# Patient Record
Sex: Female | Born: 1980 | Race: Black or African American | Hispanic: No | Marital: Single | Smoking: Current every day smoker
Health system: Southern US, Community
[De-identification: ages and names within clinical notes are randomized; demographics above are authoritative.]

---

## 2010-02-11 ENCOUNTER — Emergency Department (HOSPITAL_COMMUNITY): Admission: EM | Admit: 2010-02-11 | Discharge: 2010-02-11 | Payer: Self-pay | Admitting: Emergency Medicine

## 2014-06-20 DIAGNOSIS — N63 Unspecified lump in unspecified breast: Secondary | ICD-10-CM | POA: Insufficient documentation

## 2014-06-20 DIAGNOSIS — Z87898 Personal history of other specified conditions: Secondary | ICD-10-CM | POA: Insufficient documentation

## 2014-06-20 DIAGNOSIS — E559 Vitamin D deficiency, unspecified: Secondary | ICD-10-CM | POA: Insufficient documentation

## 2014-06-20 DIAGNOSIS — E669 Obesity, unspecified: Secondary | ICD-10-CM | POA: Insufficient documentation

## 2014-06-20 DIAGNOSIS — N926 Irregular menstruation, unspecified: Secondary | ICD-10-CM | POA: Insufficient documentation

## 2014-06-20 DIAGNOSIS — Z01419 Encounter for gynecological examination (general) (routine) without abnormal findings: Secondary | ICD-10-CM | POA: Insufficient documentation

## 2015-06-25 HISTORY — PX: BREAST BIOPSY: SHX20

## 2019-09-07 ENCOUNTER — Other Ambulatory Visit: Payer: Self-pay | Admitting: Internal Medicine

## 2019-09-07 DIAGNOSIS — M79605 Pain in left leg: Secondary | ICD-10-CM

## 2019-09-09 ENCOUNTER — Other Ambulatory Visit: Payer: Self-pay | Admitting: Internal Medicine

## 2019-09-09 ENCOUNTER — Other Ambulatory Visit: Payer: Self-pay

## 2019-09-09 ENCOUNTER — Ambulatory Visit
Admission: RE | Admit: 2019-09-09 | Discharge: 2019-09-09 | Disposition: A | Discharge status: Home / Self Care | Payer: Managed Care, Other (non HMO) | Source: Ambulatory Visit | Attending: Internal Medicine | Admitting: Internal Medicine

## 2019-09-09 DIAGNOSIS — M79605 Pain in left leg: Secondary | ICD-10-CM | POA: Diagnosis not present

## 2019-09-09 DIAGNOSIS — R0602 Shortness of breath: Secondary | ICD-10-CM

## 2019-09-09 MED ORDER — IOHEXOL 350 MG/ML SOLN
75.0000 mL | Freq: Once | INTRAVENOUS | Status: AC | PRN
  Administered 2019-09-09: 75 mL via INTRAVENOUS

## 2019-09-23 DIAGNOSIS — I82452 Acute embolism and thrombosis of left peroneal vein: Secondary | ICD-10-CM | POA: Insufficient documentation

## 2020-01-14 ENCOUNTER — Other Ambulatory Visit (HOSPITAL_COMMUNITY): Payer: Self-pay | Admitting: Internal Medicine

## 2020-01-14 ENCOUNTER — Other Ambulatory Visit: Payer: Self-pay | Admitting: Internal Medicine

## 2020-01-14 DIAGNOSIS — I82452 Acute embolism and thrombosis of left peroneal vein: Secondary | ICD-10-CM

## 2020-02-07 ENCOUNTER — Ambulatory Visit
Admission: RE | Admit: 2020-02-07 | Discharge: 2020-02-07 | Disposition: A | Discharge status: Home / Self Care | Payer: Managed Care, Other (non HMO) | Source: Ambulatory Visit | Attending: Internal Medicine | Admitting: Internal Medicine

## 2020-02-07 ENCOUNTER — Other Ambulatory Visit: Payer: Self-pay

## 2020-02-07 DIAGNOSIS — I82452 Acute embolism and thrombosis of left peroneal vein: Secondary | ICD-10-CM | POA: Diagnosis present

## 2020-05-31 ENCOUNTER — Other Ambulatory Visit
Admission: RE | Admit: 2020-05-31 | Discharge: 2020-05-31 | Disposition: A | Discharge status: Home / Self Care | Payer: Managed Care, Other (non HMO) | Source: Ambulatory Visit | Attending: Internal Medicine | Admitting: Internal Medicine

## 2020-05-31 DIAGNOSIS — R079 Chest pain, unspecified: Secondary | ICD-10-CM | POA: Insufficient documentation

## 2020-05-31 LAB — TROPONIN I (HIGH SENSITIVITY): Troponin I (High Sensitivity): 3 ng/L (ref <18)

## 2020-07-21 DIAGNOSIS — Z1231 Encounter for screening mammogram for malignant neoplasm of breast: Secondary | ICD-10-CM | POA: Diagnosis not present

## 2020-07-21 DIAGNOSIS — I82432 Acute embolism and thrombosis of left popliteal vein: Secondary | ICD-10-CM | POA: Diagnosis not present

## 2020-07-21 DIAGNOSIS — M5440 Lumbago with sciatica, unspecified side: Secondary | ICD-10-CM | POA: Diagnosis not present

## 2020-07-21 DIAGNOSIS — Z0000 Encounter for general adult medical examination without abnormal findings: Secondary | ICD-10-CM | POA: Diagnosis not present

## 2020-07-25 ENCOUNTER — Other Ambulatory Visit: Payer: Self-pay | Admitting: Internal Medicine

## 2020-07-25 DIAGNOSIS — Z1231 Encounter for screening mammogram for malignant neoplasm of breast: Secondary | ICD-10-CM

## 2020-09-18 ENCOUNTER — Other Ambulatory Visit: Payer: Self-pay | Admitting: *Deleted

## 2020-09-18 ENCOUNTER — Inpatient Hospital Stay
Admission: RE | Admit: 2020-09-18 | Discharge: 2020-09-18 | Disposition: A | Discharge status: Home / Self Care | Payer: Self-pay | Source: Ambulatory Visit | Attending: *Deleted | Admitting: *Deleted

## 2020-09-18 ENCOUNTER — Ambulatory Visit
Admission: RE | Admit: 2020-09-18 | Discharge: 2020-09-18 | Disposition: A | Discharge status: Home / Self Care | Payer: BC Managed Care – PPO | Source: Ambulatory Visit | Attending: Internal Medicine | Admitting: Internal Medicine

## 2020-09-18 ENCOUNTER — Other Ambulatory Visit: Payer: Self-pay

## 2020-09-18 DIAGNOSIS — Z1231 Encounter for screening mammogram for malignant neoplasm of breast: Secondary | ICD-10-CM | POA: Diagnosis not present

## 2020-10-23 DIAGNOSIS — H40153 Residual stage of open-angle glaucoma, bilateral: Secondary | ICD-10-CM | POA: Diagnosis not present

## 2020-10-24 ENCOUNTER — Encounter: Payer: Self-pay | Admitting: Emergency Medicine

## 2020-10-24 ENCOUNTER — Emergency Department
Admission: EM | Admit: 2020-10-24 | Discharge: 2020-10-24 | Disposition: A | Discharge status: Home / Self Care | Payer: BC Managed Care – PPO | Attending: Emergency Medicine | Admitting: Emergency Medicine

## 2020-10-24 ENCOUNTER — Other Ambulatory Visit: Payer: Self-pay | Admitting: Internal Medicine

## 2020-10-24 ENCOUNTER — Emergency Department: Payer: BC Managed Care – PPO

## 2020-10-24 DIAGNOSIS — M79605 Pain in left leg: Secondary | ICD-10-CM | POA: Diagnosis not present

## 2020-10-24 DIAGNOSIS — M79662 Pain in left lower leg: Secondary | ICD-10-CM | POA: Insufficient documentation

## 2020-10-24 DIAGNOSIS — M7989 Other specified soft tissue disorders: Secondary | ICD-10-CM

## 2020-10-24 DIAGNOSIS — F17210 Nicotine dependence, cigarettes, uncomplicated: Secondary | ICD-10-CM | POA: Diagnosis not present

## 2020-10-24 DIAGNOSIS — N390 Urinary tract infection, site not specified: Secondary | ICD-10-CM | POA: Insufficient documentation

## 2020-10-24 DIAGNOSIS — I82432 Acute embolism and thrombosis of left popliteal vein: Secondary | ICD-10-CM

## 2020-10-24 DIAGNOSIS — Z8616 Personal history of COVID-19: Secondary | ICD-10-CM | POA: Insufficient documentation

## 2020-10-24 DIAGNOSIS — R002 Palpitations: Secondary | ICD-10-CM | POA: Insufficient documentation

## 2020-10-24 DIAGNOSIS — R079 Chest pain, unspecified: Secondary | ICD-10-CM | POA: Diagnosis not present

## 2020-10-24 LAB — URINALYSIS, COMPLETE (UACMP) WITH MICROSCOPIC
Bilirubin Urine: NEGATIVE
Glucose, UA: NEGATIVE mg/dL
Ketones, ur: NEGATIVE mg/dL
Nitrite: NEGATIVE
Protein, ur: NEGATIVE mg/dL
Specific Gravity, Urine: 1.018 (ref 1.005–1.030)
pH: 6 (ref 5.0–8.0)

## 2020-10-24 LAB — TSH: TSH: 4.633 u[IU]/mL — ABNORMAL HIGH (ref 0.350–4.500)

## 2020-10-24 LAB — CBC WITH DIFFERENTIAL/PLATELET
Abs Immature Granulocytes: 0.01 10*3/uL (ref 0.00–0.07)
Basophils Absolute: 0 10*3/uL (ref 0.0–0.1)
Basophils Relative: 0 %
Eosinophils Absolute: 0.1 10*3/uL (ref 0.0–0.5)
Eosinophils Relative: 1 %
HCT: 38.1 % (ref 36.0–46.0)
Hemoglobin: 13.4 g/dL (ref 12.0–15.0)
Immature Granulocytes: 0 %
Lymphocytes Relative: 47 %
Lymphs Abs: 4.6 10*3/uL — ABNORMAL HIGH (ref 0.7–4.0)
MCH: 31.8 pg (ref 26.0–34.0)
MCHC: 35.2 g/dL (ref 30.0–36.0)
MCV: 90.5 fL (ref 80.0–100.0)
Monocytes Absolute: 0.7 10*3/uL (ref 0.1–1.0)
Monocytes Relative: 7 %
Neutro Abs: 4.5 10*3/uL (ref 1.7–7.7)
Neutrophils Relative %: 45 %
Platelets: 379 10*3/uL (ref 150–400)
RBC: 4.21 MIL/uL (ref 3.87–5.11)
RDW: 12.8 % (ref 11.5–15.5)
WBC: 10 10*3/uL (ref 4.0–10.5)
nRBC: 0 % (ref 0.0–0.2)

## 2020-10-24 LAB — COMPREHENSIVE METABOLIC PANEL
ALT: 11 U/L (ref 0–44)
AST: 12 U/L — ABNORMAL LOW (ref 15–41)
Albumin: 3.5 g/dL (ref 3.5–5.0)
Alkaline Phosphatase: 58 U/L (ref 38–126)
Anion gap: 7 (ref 5–15)
BUN: 12 mg/dL (ref 6–20)
CO2: 26 mmol/L (ref 22–32)
Calcium: 8.7 mg/dL — ABNORMAL LOW (ref 8.9–10.3)
Chloride: 105 mmol/L (ref 98–111)
Creatinine, Ser: 0.71 mg/dL (ref 0.44–1.00)
GFR, Estimated: >60 mL/min (ref >60)
Glucose, Bld: 97 mg/dL (ref 70–99)
Potassium: 4 mmol/L (ref 3.5–5.1)
Sodium: 138 mmol/L (ref 135–145)
Total Bilirubin: 0.5 mg/dL (ref 0.3–1.2)
Total Protein: 6.4 g/dL — ABNORMAL LOW (ref 6.5–8.1)

## 2020-10-24 LAB — T4, FREE: Free T4: 1.03 ng/dL (ref 0.61–1.12)

## 2020-10-24 LAB — POC URINE PREG, ED: Preg Test, Ur: NEGATIVE

## 2020-10-24 LAB — BRAIN NATRIURETIC PEPTIDE: B Natriuretic Peptide: 16.2 pg/mL (ref 0.0–100.0)

## 2020-10-24 LAB — TROPONIN I (HIGH SENSITIVITY)
Troponin I (High Sensitivity): 3 ng/L (ref <18)
Troponin I (High Sensitivity): 3 ng/L (ref <18)

## 2020-10-24 MED ORDER — CEPHALEXIN 500 MG PO CAPS
500.0000 mg | ORAL_CAPSULE | Freq: Once | ORAL | Status: AC
  Administered 2020-10-24: 500 mg via ORAL
  Filled 2020-10-24: qty 1

## 2020-10-24 MED ORDER — SODIUM CHLORIDE 0.9 % IV BOLUS
1000.0000 mL | Freq: Once | INTRAVENOUS | Status: AC
  Administered 2020-10-24: 1000 mL via INTRAVENOUS

## 2020-10-24 MED ORDER — IOHEXOL 350 MG/ML SOLN
100.0000 mL | Freq: Once | INTRAVENOUS | Status: AC | PRN
  Administered 2020-10-24: 100 mL via INTRAVENOUS

## 2020-10-24 MED ORDER — CEPHALEXIN 500 MG PO CAPS: 500.0000 mg | ORAL_CAPSULE | Freq: Three times a day (TID) | ORAL | 0 refills | Status: AC

## 2020-10-24 NOTE — ED Notes (Signed)
Pt remains in CT

## 2020-10-24 NOTE — Discharge Instructions (Signed)
1.  Take antibiotic as prescribed (Keflex 500 mg  3 times daily x7 days). 2.  Speak with your doctor about your increasing thyroid function. 3.  Return to the ER for worsening symptoms, persistent vomiting, difficulty breathing or other concerns.

## 2020-10-24 NOTE — ED Triage Notes (Signed)
Pt c/o heat palpitations x1 week. 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 Pt recently experiencing increased pain in the left lower leg and has follow up care for [place]. Pt concerned due to diagnosis of COVID this January and new symptom of palpitations and pain in left leg over the last week.

## 2020-10-24 NOTE — ED Notes (Signed)
Pt to CT

## 2020-10-24 NOTE — ED Notes (Signed)
Pt reports heart palpations and left leg pain.  Hx blood clot.  No sob.  cig smoker.  Pt alert  Iv in place.  Family with pt.

## 2020-10-24 NOTE — ED Notes (Signed)
poct pregnancy Negative 

## 2020-10-24 NOTE — ED Provider Notes (Signed)
[facility] Emergency Department Provider Note   ____________________________________________   Event Date/Time   First MD Initiated Contact with Patient 10/24/20 0154     (approximate)  I have reviewed the triage vital signs and the nursing notes.   HISTORY  Chief Complaint Palpitations    HPI Carrie Wise is a 40 y.o. female who presents to the ED from home with a 1 week history of intermittent heart palpitations.  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  Had COVID-19 again, testing + June 24, 2020.  Has been having pain in her left calf and behind her knee this week.  Given the calf pain and heart palpitations, patient wanted to be evaluated for blood clots.  Denies fever, cough, chest pain, shortness of breath, abdominal pain, nausea, vomiting or dizziness.  Drinks 1 cup of black coffee daily.  No sodas x2 weeks.  No stimulants drug use or energy drinks.  Denies recent travel or trauma.      Past medical history COVID-19 Left lower leg DVT  There are no problems to display for this patient.   Past Surgical History:  Procedure Laterality Date  . BREAST BIOPSY Right 2017   [place] BX, benign- Duct    Prior to Admission medications   Not on File    Allergies Morphine and related  History reviewed. No pertinent family history.  Social History Social History   Tobacco Use  . Smoking status: Current Every Day Smoker  . Smokeless tobacco: Never Used    Review of Systems  Constitutional: No fever/chills Eyes: No visual changes. ENT: No sore throat. Cardiovascular: Positive for palpitations.  Denies chest pain. Respiratory: Denies shortness of breath. Gastrointestinal: No abdominal pain.  No nausea, no vomiting.  No diarrhea.  No constipation. Genitourinary: Negative for dysuria. Musculoskeletal: Positive for left calf pain.  Negative for back pain. Skin: Negative for  rash. Neurological: Negative for headaches, focal weakness or numbness.   ____________________________________________   PHYSICAL EXAM:  VITAL SIGNS: ED Triage Vitals [10/24/20 0158]  Enc Vitals Group     BP      Pulse      Resp      Temp      Temp src      SpO2      Weight 265 lb (120.2 kg)     Height 5\' 5"  (1.651 m)     Head Circumference      Peak Flow      Pain Score      Pain Loc      Pain Edu?      Excl. in GC?     Constitutional: Alert and oriented. Well appearing and in no acute distress. Eyes: Conjunctivae are normal. PERRL. EOMI. Head: Atraumatic. Nose: No congestion/rhinnorhea. Mouth/Throat: Mucous membranes are moist.   Neck: No stridor.  No thyromegaly. Cardiovascular: Normal rate, regular rhythm. Grossly normal heart sounds.  Good peripheral circulation. Respiratory: Normal respiratory effort.  No retractions. Lungs CTAB. Gastrointestinal: Soft and nontender. No distention. No abdominal bruits. No CVA tenderness. Musculoskeletal:  LLE: Mild pain behind knee.  No calf pain.  2+ distal pulses.  Brisk, less than 5-second capillary refill. Neurologic:  Normal speech and language. No gross focal neurologic deficits are appreciated. No gait instability. Skin:  Skin is warm, dry and intact. No rash noted. Psychiatric: Mood and affect are normal. Speech and behavior are normal.  ____________________________________________   LABS (all labs ordered  are listed, but only abnormal results are displayed)  Labs Reviewed  CBC WITH DIFFERENTIAL/PLATELET - Abnormal; Notable for the following components:      Result Value   Lymphs Abs 4.6 (*)    All other components within normal limits  COMPREHENSIVE METABOLIC PANEL - Abnormal; Notable for the following components:   Calcium 8.7 (*)    Total Protein 6.4 (*)    AST 12 (*)    All other components within normal limits  TSH - Abnormal; Notable for the following components:   TSH 4.633 (*)    All other components  within normal limits  URINALYSIS, COMPLETE (UACMP) WITH MICROSCOPIC - Abnormal; Notable for the following components:   Color, Urine YELLOW (*)    APPearance CLEAR (*)    Hgb urine dipstick SMALL (*)    Leukocytes,Ua MODERATE (*)    Bacteria, UA RARE (*)    All other components within normal limits  URINE CULTURE  BRAIN NATRIURETIC PEPTIDE  T4, FREE  POC URINE PREG, ED  TROPONIN I (HIGH SENSITIVITY)  TROPONIN I (HIGH SENSITIVITY)   ____________________________________________  EKG  ED ECG REPORT I, Daryn Hicks J, the attending physician, personally viewed and interpreted this ECG.   Date: 10/24/2020  EKG Time: 0208  Rate: 75  Rhythm: normal EKG, normal sinus rhythm  Axis: Normal  Intervals:none  ST&T Change: Nonspecific  ____________________________________________  RADIOLOGY I, Amiel Sharrow J, personally viewed and evaluated these images (plain radiographs) as part of my medical decision making, as well as reviewing the written report by the radiologist.  ED MD interpretation: Negative chest x-ray; no DVT, no PE  Official radiology report(s): CT Angio Chest PE W/Cm &/Or Wo Cm  Result Date: 10/24/2020 CLINICAL DATA:  Palpitations for 1 week. History of DVT on Eliquis. Left leg pain EXAM: CT ANGIOGRAPHY CHEST WITH CONTRAST TECHNIQUE: Multidetector CT imaging of the chest was performed using the standard protocol during bolus administration of intravenous contrast. Multiplanar CT image reconstructions and MIPs were obtained to evaluate the vascular anatomy. CONTRAST:  OMNIPAQUE IOHEXOL 350 MG/ML SOLN COMPARISON:  09/09/2019 FINDINGS: Cardiovascular: Satisfactory opacification of the pulmonary arteries to the segmental level. No evidence of pulmonary embolism. Normal heart size. No pericardial effusion. Mediastinum/Nodes: Negative for adenopathy or mass Lungs/Pleura: There is no edema, consolidation, effusion, or pneumothorax. Stable generalized airway thickening. Upper Abdomen:  Negative Musculoskeletal: Negative Review of the MIP images confirms the above findings. IMPRESSION: Stable from March 2021.  Negative for pulmonary embolism. Electronically Signed   By: Marnee Spring M.D.   On: 10/24/2020 04:15   US Venous Img Lower Unilateral Left (DVT)  Result Date: 10/24/2020 CLINICAL DATA:  History of DVT on Eliquis. Left leg pain and palpitations EXAM: 09/09/2019 LOWER EXTREMITY VENOUS DOPPLER ULTRASOUND TECHNIQUE: Gray-scale sonography with compression, as well as color and duplex ultrasound, were performed to evaluate the deep venous system(s) from the level of the common femoral vein through the popliteal and proximal calf veins. COMPARISON:  09/09/2019 FINDINGS: VENOUS Normal compressibility of the common femoral, superficial femoral, and popliteal veins, as well as the visualized calf veins. Visualized portions of profunda femoral vein and great saphenous vein unremarkable. No filling defects to suggest DVT on grayscale or color Doppler imaging. Doppler waveforms show normal direction of venous flow, normal respiratory plasticity and response to augmentation. Limited views of the contralateral common femoral vein are unremarkable. IMPRESSION: Negative for DVT in the left lower extremity. Electronically Signed   By: Marnee Spring M.D.   On: 10/24/2020 04:47  DG Chest Port 1 View  Result Date: 10/24/2020 CLINICAL DATA:  Left lower extremity pain and palpitations. EXAM: PORTABLE CHEST 1 VIEW COMPARISON:  None. FINDINGS: The heart size and mediastinal contours are within normal limits. Both lungs are clear. The visualized skeletal structures are unremarkable. IMPRESSION: No active disease. Electronically Signed   By: Aram Candela M.D.   On: 10/24/2020 02:42    ____________________________________________   PROCEDURES  Procedure(s) performed (including Critical Care):  .1-3 Lead EKG Interpretation Performed by: Irean Hong, MD Authorized by: Irean Hong, MD      Interpretation: normal     ECG rate:  80   ECG rate assessment: normal     Rhythm: sinus rhythm     Ectopy: none     Conduction: normal   Comments:     Patient placed on cardiac monitor to evaluate for arrhythmias     ____________________________________________   INITIAL IMPRESSION / ASSESSMENT AND PLAN / ED COURSE  As part of my medical decision making, I reviewed the following data within the electronic MEDICAL RECORD NUMBER History obtained from family, Nursing notes reviewed and incorporated, Labs reviewed, EKG interpreted, Old chart reviewed, Radiograph reviewed and Notes from prior ED visits     40 year old female presenting with palpitations and left calf pain, currently on Eliquis for post-COVID DVT. Differential diagnosis includes, but is not limited to, ACS, aortic dissection, pulmonary embolism, cardiac tamponade, pneumothorax, pneumonia, pericarditis, myocarditis, GI-related causes including esophagitis/gastritis, and musculoskeletal chest wall pain.    Will obtain cardiac work-up, check thyroid panel.  Obtain portable chest x-ray.  If patient's renal function can withstand it, will obtain CTA chest to evaluate for PE.  Obtain left lower leg venous ultrasound.  Will continue to monitor and reassess.  Clinical Course as of 10/24/20 0529  Tue Oct 24, 2020  3149 Patient resting in no acute distress.  Updated her on all test results.  She notes that her TSH has steadily gone up since December and will follow up closely with her PCP for that.  Will place on Keflex for leukocyte positive UTI.  Strict return precautions given.  Patient verbalizes understanding agrees with plan of care. [JS]    Clinical Course User Index [JS] Irean Hong, MD     ____________________________________________   FINAL CLINICAL IMPRESSION(S) / ED DIAGNOSES  Final diagnoses:  Heart palpitations  Lower urinary tract infectious disease     ED Discharge Orders    None      *Please note:   Lyrah D Kissner was evaluated in Emergency Department on 10/24/2020 for the symptoms described in the history of present illness. She was evaluated in the context of the global COVID-19 pandemic, which necessitated consideration that the patient might be at risk for infection with the SARS-CoV-2 virus that causes COVID-19. Institutional protocols and algorithms that pertain to the evaluation of patients at risk for COVID-19 are in a state of rapid change based on information released by regulatory bodies including the CDC and federal and state organizations. These policies and algorithms were followed during the patient's care in the ED.  Some ED evaluations and interventions may be delayed as a result of limited staffing during and the pandemic.*   Note:  This document was prepared using Dragon voice recognition software and may include unintentional dictation errors.   Irean Hong, MD 10/24/20 [id number]

## 2020-10-25 ENCOUNTER — Ambulatory Visit: Payer: Managed Care, Other (non HMO)

## 2020-10-25 DIAGNOSIS — R002 Palpitations: Secondary | ICD-10-CM | POA: Diagnosis not present

## 2020-10-25 DIAGNOSIS — M79605 Pain in left leg: Secondary | ICD-10-CM | POA: Diagnosis not present

## 2020-10-25 DIAGNOSIS — R946 Abnormal results of thyroid function studies: Secondary | ICD-10-CM | POA: Diagnosis not present

## 2020-12-06 DIAGNOSIS — H40153 Residual stage of open-angle glaucoma, bilateral: Secondary | ICD-10-CM | POA: Diagnosis not present

## 2021-01-19 DIAGNOSIS — I825Z2 Chronic embolism and thrombosis of unspecified deep veins of left distal lower extremity: Secondary | ICD-10-CM | POA: Diagnosis not present

## 2021-01-19 DIAGNOSIS — G4733 Obstructive sleep apnea (adult) (pediatric): Secondary | ICD-10-CM | POA: Diagnosis not present

## 2021-01-19 DIAGNOSIS — R7303 Prediabetes: Secondary | ICD-10-CM | POA: Diagnosis not present

## 2021-02-08 ENCOUNTER — Other Ambulatory Visit: Payer: Self-pay

## 2021-02-08 ENCOUNTER — Encounter (INDEPENDENT_AMBULATORY_CARE_PROVIDER_SITE_OTHER): Payer: Self-pay | Admitting: Nurse Practitioner

## 2021-02-08 ENCOUNTER — Ambulatory Visit (INDEPENDENT_AMBULATORY_CARE_PROVIDER_SITE_OTHER): Payer: BC Managed Care – PPO | Admitting: Nurse Practitioner

## 2021-02-08 VITALS — BP 131/82 | HR 88 | Resp 18 | Ht 66.0 in | Wt 264.0 lb

## 2021-02-08 DIAGNOSIS — I82452 Acute embolism and thrombosis of left peroneal vein: Secondary | ICD-10-CM | POA: Diagnosis not present

## 2021-02-16 ENCOUNTER — Ambulatory Visit (INDEPENDENT_AMBULATORY_CARE_PROVIDER_SITE_OTHER): Payer: BC Managed Care – PPO | Admitting: Nurse Practitioner

## 2021-02-16 ENCOUNTER — Other Ambulatory Visit: Payer: Self-pay

## 2021-02-16 ENCOUNTER — Ambulatory Visit (INDEPENDENT_AMBULATORY_CARE_PROVIDER_SITE_OTHER): Payer: BC Managed Care – PPO

## 2021-02-16 VITALS — BP 116/76 | HR 76 | Ht 66.0 in | Wt 258.0 lb

## 2021-02-16 DIAGNOSIS — I82432 Acute embolism and thrombosis of left popliteal vein: Secondary | ICD-10-CM | POA: Diagnosis not present

## 2021-02-16 DIAGNOSIS — R600 Localized edema: Secondary | ICD-10-CM

## 2021-02-16 DIAGNOSIS — M7989 Other specified soft tissue disorders: Secondary | ICD-10-CM

## 2021-02-16 DIAGNOSIS — I82452 Acute embolism and thrombosis of left peroneal vein: Secondary | ICD-10-CM

## 2021-02-18 ENCOUNTER — Encounter (INDEPENDENT_AMBULATORY_CARE_PROVIDER_SITE_OTHER): Payer: Self-pay | Admitting: Nurse Practitioner

## 2021-02-18 NOTE — Progress Notes (Signed)
Subjective:    Patient ID: Carrie Wise, female    DOB: 1980/09/19, 40 y.o.   MRN: [id number] Chief Complaint  Patient presents with   Follow-up    [place] F/U    Carrie Wise is a 40 year old female that returns today for noninvasive studies related to pain in her lower extremities.  The patient was referred by her PCP due to pain in her left lower extremity.  The patient had a left peroneal vein DVT.  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  The patient notes that she normally has pain in her calf area that is achy and happens mostly at night at the end of the day.  However recently she began to have pain in her hip/buttocks area that radiates down her leg into her calf.  She denies any open wounds or ulcerations or classic claudication-like symptoms.  The patient had noninvasive studies done today which shows evidence of reflux in the great saphenous vein at the saphenofemoral junction.  Otherwise there is no evidence of superficial venous reflux in the short saphenous vein.  No evidence of deep venous insufficiency no evidence of DVT.   Review of Systems  Cardiovascular:  Positive for leg swelling.  Musculoskeletal:  Positive for myalgias.  All other systems reviewed and are negative.     Objective:   Physical Exam Vitals reviewed.  HENT:     Head: Normocephalic.  Cardiovascular:     Rate and Rhythm: Normal rate.  Pulmonary:     Effort: Pulmonary effort is normal.  Musculoskeletal:     Left lower leg: Edema present.  Skin:    General: Skin is warm and dry.  Neurological:     Mental Status: She is alert and oriented to person, place, and time.  Psychiatric:        Mood and Affect: Mood normal.        Behavior: Behavior normal.        Thought Content: Thought content normal.        Judgment: Judgment normal.    BP 116/76   Pulse 76   Ht 5\' 6"  (1.676 m)   Wt 258 lb (117 kg)   BMI 41.64 kg/m   History reviewed. No pertinent past medical  history.  Social History   Socioeconomic History   Marital status: Single    Spouse name: Not on file   Number of children: Not on file   Years of education: Not on file   Highest education level: Not on file  Occupational History   Not on file  Tobacco Use   Smoking status: Every Day   Smokeless tobacco: Never  Substance and Sexual Activity   Alcohol use: Not on file   Drug use: Not on file   Sexual activity: Not on file  Other Topics Concern   Not on file  Social History Narrative   Not on file   Social Determinants of Health   Financial Resource Strain: Not on file  Food Insecurity: Not on file  Transportation Needs: Not on file  Physical Activity: Not on file  Stress: Not on file  Social Connections: Not on file  Intimate Partner Violence: Not on file    Past Surgical History:  Procedure Laterality Date   BREAST BIOPSY Right 2017   2018 BX, benign- Duct    History reviewed. No pertinent family history.  Allergies  Allergen Reactions   Morphine And Related    Morphine Rash  CBC Latest Ref Rng & Units 10/24/2020  WBC 4.0 - 10.5 K/uL 10.0  Hemoglobin 12.0 - 15.0 g/dL 61.9  Hematocrit 50.9 - 46.0 % 38.1  Platelets 150 - 400 K/uL 379      CMP     Component Value Date/Time   NA 138 10/24/2020 0209   K 4.0 10/24/2020 0209   CL 105 10/24/2020 0209   CO2 26 10/24/2020 0209   GLUCOSE 97 10/24/2020 0209   BUN 12 10/24/2020 0209   CREATININE 0.71 10/24/2020 0209   CALCIUM 8.7 (L) 10/24/2020 0209   PROT 6.4 (L) 10/24/2020 0209   ALBUMIN 3.5 10/24/2020 0209   AST 12 (L) 10/24/2020 0209   ALT 11 10/24/2020 0209   ALKPHOS 58 10/24/2020 0209   BILITOT 0.5 10/24/2020 0209   GFRNONAA >60 10/24/2020 0209     No results found.     Assessment & Plan:   1. 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  The patient is advised to follow-up with conservative  therapy plan as outlined below.  2. Left leg swelling The patient does have evidence of superficial venous reflux at her saphenofemoral junction.  The patient is advised to wear medical grade compression stockings daily.  She can use knee-high or thigh-high.  She should use 20 to 30 mmHg and she should wear these compression socks daily.  They should be put on first thing in the morning and remove before bedtime.  She should not sleep with these.  Also elevate her lower extremities is much as possible when not active.  The patient will return in 3 months to determine the effectiveness of compression therapy on her discomfort as well as discuss possible neck steps.   Current Outpatient Medications on File Prior to Visit  Medication Sig Dispense Refill   cephALEXin (KEFLEX) 500 MG capsule Take 1 capsule (500 mg total) by mouth 3 (three) times daily. 21 capsule 0   ELIQUIS 5 MG TABS tablet SMARTSIG:1 Tablet(s) By Mouth Every 12 Hours     Semaglutide-Weight Management 0.5 MG/0.5ML SOAJ Inject into the skin.     No current facility-administered medications on file prior to visit.    There are no Patient Instructions on file for this visit. No follow-ups on file.   Georgiana Spinner, NP

## 2021-02-19 ENCOUNTER — Encounter (INDEPENDENT_AMBULATORY_CARE_PROVIDER_SITE_OTHER): Payer: Self-pay | Admitting: Nurse Practitioner

## 2021-02-19 NOTE — Progress Notes (Signed)
Subjective:    Patient ID: Carrie Wise, female    DOB: Aug 29, 1980, 40 y.o.   MRN: [id number] Chief Complaint  Patient presents with   New Patient (Initial Visit)    Carrie Wise is a 40 year old female that returns today for noninvasive studies related to pain in her lower extremities.  The patient was referred by her PCP due to pain in her left lower extremity.  The patient had COVID around April or May 2021 and then again this year.  The patient had a left peroneal vein DVT.  However since that time she is persisted to have pain in that left lower extremity.  The patient also continues on anticoagulation.  Currently the pain is in the patient's calf and is an aching and throbbing sensation.  Sometimes by the time the patient is off work the pain is unbearable.  She has worn compression socks previously but not consistently.  However she notes that recently she had pain from her thigh to her knee.  Recent noninvasive studies showed no evidence of DVT within the left lower extremity.     Review of Systems  Musculoskeletal:  Positive for myalgias.  All other systems reviewed and are negative.     Objective:   Physical Exam Vitals reviewed.  HENT:     Head: Normocephalic.  Cardiovascular:     Rate and Rhythm: Normal rate.  Pulmonary:     Effort: Pulmonary effort is normal.  Skin:    General: Skin is warm and dry.  Neurological:     Mental Status: She is alert and oriented to person, place, and time.  Psychiatric:        Mood and Affect: Mood normal.        Behavior: Behavior normal.        Thought Content: Thought content normal.        Judgment: Judgment normal.    BP 131/82 (BP Location: Left Arm, Patient Position: Sitting, Cuff Size: Large)   Pulse 88   Resp 18   Ht 5\' 6"  (1.676 m)   Wt 264 lb (119.7 kg)   BMI 42.61 kg/m   History reviewed. No pertinent past medical history.  Social History   Socioeconomic History   Marital status: Single    Spouse name:  Not on file   Number of children: Not on file   Years of education: Not on file   Highest education level: Not on file  Occupational History   Not on file  Tobacco Use   Smoking status: Every Day   Smokeless tobacco: Never  Substance and Sexual Activity   Alcohol use: Not on file   Drug use: Not on file   Sexual activity: Not on file  Other Topics Concern   Not on file  Social History Narrative   Not on file   Social Determinants of Health   Financial Resource Strain: Not on file  Food Insecurity: Not on file  Transportation Needs: Not on file  Physical Activity: Not on file  Stress: Not on file  Social Connections: Not on file  Intimate Partner Violence: Not on file    Past Surgical History:  Procedure Laterality Date   BREAST BIOPSY Right 2017   2018 BX, benign- Duct    History reviewed. No pertinent family history.  Allergies  Allergen Reactions   Morphine And Related    Morphine Rash    CBC Latest Ref Rng & Units 10/24/2020  WBC 4.0 - 10.5 K/uL 10.0  Hemoglobin 12.0 - 15.0 g/dL 29.5  Hematocrit 28.4 - 46.0 % 38.1  Platelets 150 - 400 K/uL 379      CMP     Component Value Date/Time   NA 138 10/24/2020 0209   K 4.0 10/24/2020 0209   CL 105 10/24/2020 0209   CO2 26 10/24/2020 0209   GLUCOSE 97 10/24/2020 0209   BUN 12 10/24/2020 0209   CREATININE 0.71 10/24/2020 0209   CALCIUM 8.7 (L) 10/24/2020 0209   PROT 6.4 (L) 10/24/2020 0209   ALBUMIN 3.5 10/24/2020 0209   AST 12 (L) 10/24/2020 0209   ALT 11 10/24/2020 0209   ALKPHOS 58 10/24/2020 0209   BILITOT 0.5 10/24/2020 0209   GFRNONAA >60 10/24/2020 0209     No results found.     Assessment & Plan:   1. Acute deep vein thrombosis (DVT) of left peroneal vein (HCC) I suspect that the patient is suffering from postphlebitic symptoms.  Postphlebitic symptoms can occur along after DVT has resolved.  Typically treatment for postphlebitic symptoms include use of medical grade compression stockings  using 20 to 30 mmHg.  They should be worn daily.  They should replace first thing in the morning or before bedtime.  The patient should not sleep in them.  She should also try to elevate her lower extremities whenever she is not being active.  The patient should also be active and strive for 15 to 20 minutes of activity 4 to 5 days a week.  In some instances continue anticoagulation can also help postphlebitic symptoms and the patient continues on 5 mg of Eliquis twice daily.  We will have the patient return for noninvasive studies to determine if there is any evidence of venous reflux that could also account for her pain and discomfort as well.  In the interim the patient will continue to follow conservative therapy as outlined above.   Current Outpatient Medications on File Prior to Visit  Medication Sig Dispense Refill   ELIQUIS 5 MG TABS tablet SMARTSIG:1 Tablet(s) By Mouth Every 12 Hours     Semaglutide-Weight Management 0.5 MG/0.5ML SOAJ Inject into the skin.     cephALEXin (KEFLEX) 500 MG capsule Take 1 capsule (500 mg total) by mouth 3 (three) times daily. 21 capsule 0   No current facility-administered medications on file prior to visit.    There are no Patient Instructions on file for this visit. No follow-ups on file.   Georgiana Spinner, NP

## 2021-04-24 ENCOUNTER — Ambulatory Visit (INDEPENDENT_AMBULATORY_CARE_PROVIDER_SITE_OTHER): Payer: BC Managed Care – PPO | Admitting: Nurse Practitioner

## 2021-04-24 ENCOUNTER — Encounter (INDEPENDENT_AMBULATORY_CARE_PROVIDER_SITE_OTHER): Payer: Self-pay | Admitting: Nurse Practitioner

## 2021-04-24 ENCOUNTER — Other Ambulatory Visit: Payer: Self-pay

## 2021-04-24 VITALS — BP 135/83 | HR 74 | Ht 65.0 in | Wt 257.0 lb

## 2021-04-24 DIAGNOSIS — I825Z2 Chronic embolism and thrombosis of unspecified deep veins of left distal lower extremity: Secondary | ICD-10-CM | POA: Diagnosis not present

## 2021-04-24 DIAGNOSIS — R7989 Other specified abnormal findings of blood chemistry: Secondary | ICD-10-CM | POA: Diagnosis not present

## 2021-04-24 DIAGNOSIS — M79605 Pain in left leg: Secondary | ICD-10-CM

## 2021-04-24 DIAGNOSIS — M7989 Other specified soft tissue disorders: Secondary | ICD-10-CM

## 2021-04-24 DIAGNOSIS — R7303 Prediabetes: Secondary | ICD-10-CM | POA: Diagnosis not present

## 2021-05-05 NOTE — Progress Notes (Signed)
Subjective:    Patient ID: Christiana Pellant, female    DOB: 08-21-1980, 40 y.o.   MRN: [id number] Chief Complaint  Patient presents with   Follow-up    3 mo no studies    Marrah Sigley is a 40 year old female that returns today for follow-up after conservative therapy.  The patient had a left peroneal vein DVT.  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  The patient notes that she normally has pain in her calf area that is achy and happens mostly at night at the end of the day.  However recently she began to have pain in her hip/buttocks area that radiates down her leg into her calf.  She denies any open wounds or ulcerations or classic claudication-like symptoms.  The patient had noninvasive studies done today which shows evidence of reflux in the great saphenous vein at the saphenofemoral junction.  Otherwise there is no evidence of superficial venous reflux in the short saphenous vein.  No evidence of deep venous insufficiency no evidence of DVT.  The patient is underwent 3 months of conservative therapy and has not noted any difference in her pain or discomfort.  Her swelling however has been controlled.   Review of Systems  Cardiovascular:  Negative for leg swelling.      Objective:   Physical Exam Vitals reviewed.  HENT:     Head: Normocephalic.  Cardiovascular:     Rate and Rhythm: Normal rate.     Pulses: Normal pulses.  Pulmonary:     Effort: Pulmonary effort is normal.  Skin:    General: Skin is warm and dry.  Neurological:     Mental Status: She is alert and oriented to person, place, and time.  Psychiatric:        Mood and Affect: Mood normal.        Behavior: Behavior normal.        Thought Content: Thought content normal.        Judgment: Judgment normal.    BP 135/83   Pulse 74   Ht 5\' 5"  (1.651 m)   Wt 257 lb (116.6 kg)   BMI 42.77 kg/m   History reviewed. No pertinent past medical history.  Social History   Socioeconomic History    Marital status: Single    Spouse name: Not on file   Number of children: Not on file   Years of education: Not on file   Highest education level: Not on file  Occupational History   Not on file  Tobacco Use   Smoking status: Every Day   Smokeless tobacco: Never  Substance and Sexual Activity   Alcohol use: Not on file   Drug use: Not on file   Sexual activity: Not on file  Other Topics Concern   Not on file  Social History Narrative   Not on file   Social Determinants of Health   Financial Resource Strain: Not on file  Food Insecurity: Not on file  Transportation Needs: Not on file  Physical Activity: Not on file  Stress: Not on file  Social Connections: Not on file  Intimate Partner Violence: Not on file    Past Surgical History:  Procedure Laterality Date   BREAST BIOPSY Right 2017   2018 BX, benign- Duct    History reviewed. No pertinent family history.  Allergies  Allergen Reactions   Morphine And Related    Morphine Rash    CBC Latest Ref Rng &  Units 10/24/2020  WBC 4.0 - 10.5 K/uL 10.0  Hemoglobin 12.0 - 15.0 g/dL 13.4  Hematocrit 36.0 - 46.0 % 38.1  Platelets 150 - 400 K/uL 379      CMP     Component Value Date/Time   NA 138 10/24/2020 0209   K 4.0 10/24/2020 0209   CL 105 10/24/2020 0209   CO2 26 10/24/2020 0209   GLUCOSE 97 10/24/2020 0209   BUN 12 10/24/2020 0209   CREATININE 0.71 10/24/2020 0209   CALCIUM 8.7 (L) 10/24/2020 0209   PROT 6.4 (L) 10/24/2020 0209   ALBUMIN 3.5 10/24/2020 0209   AST 12 (L) 10/24/2020 0209   ALT 11 10/24/2020 0209   ALKPHOS 58 10/24/2020 0209   BILITOT 0.5 10/24/2020 0209   GFRNONAA >60 10/24/2020 0209     No results found.     Assessment & Plan:   1. Leg pain, posterior, left Based on the patient's description of pain I suspect it is either related to her hip or possibly her lower back.  In order to evaluate we will send the patient to orthopedic surgery for further work-up - Ambulatory referral to  Orthopedic Surgery  2. Left leg swelling Based on the patient's description of discomfort and pain it is not likely is related to the reflux in her saphenofemoral junction.  She has been wearing compression socks diligently as well as using elevation and exercise and it is not helped resolve the pain.  Pain is also not quite consistent with postphlebitic syndrome.  Patient is advised to continue with conservative therapy and she will follow-up with [place] on an as-needed basis.   Current Outpatient Medications on File Prior to Visit  Medication Sig Dispense Refill   cephALEXin (KEFLEX) 500 MG capsule Take 1 capsule (500 mg total) by mouth 3 (three) times daily. 21 capsule 0   ELIQUIS 5 MG TABS tablet SMARTSIG:1 Tablet(s) By Mouth Every 12 Hours     Semaglutide-Weight Management 0.5 MG/0.5ML SOAJ Inject into the skin.     No current facility-administered medications on file prior to visit.    There are no Patient Instructions on file for this visit. No follow-ups on file.   Kris Hartmann, NP

## 2021-05-06 ENCOUNTER — Encounter (INDEPENDENT_AMBULATORY_CARE_PROVIDER_SITE_OTHER): Payer: Self-pay | Admitting: Nurse Practitioner

## 2021-06-20 ENCOUNTER — Encounter (INDEPENDENT_AMBULATORY_CARE_PROVIDER_SITE_OTHER): Payer: Self-pay

## 2021-10-08 DIAGNOSIS — Z1159 Encounter for screening for other viral diseases: Secondary | ICD-10-CM | POA: Diagnosis not present

## 2021-10-08 DIAGNOSIS — Z13220 Encounter for screening for lipoid disorders: Secondary | ICD-10-CM | POA: Diagnosis not present

## 2021-10-08 DIAGNOSIS — R7303 Prediabetes: Secondary | ICD-10-CM | POA: Diagnosis not present

## 2021-10-08 DIAGNOSIS — E538 Deficiency of other specified B group vitamins: Secondary | ICD-10-CM | POA: Diagnosis not present

## 2021-10-08 DIAGNOSIS — Z0000 Encounter for general adult medical examination without abnormal findings: Secondary | ICD-10-CM | POA: Diagnosis not present

## 2021-10-08 DIAGNOSIS — R946 Abnormal results of thyroid function studies: Secondary | ICD-10-CM | POA: Diagnosis not present

## 2021-10-15 DIAGNOSIS — I825Z2 Chronic embolism and thrombosis of unspecified deep veins of left distal lower extremity: Secondary | ICD-10-CM | POA: Diagnosis not present

## 2021-10-15 DIAGNOSIS — G4733 Obstructive sleep apnea (adult) (pediatric): Secondary | ICD-10-CM | POA: Diagnosis not present

## 2021-10-15 DIAGNOSIS — Z720 Tobacco use: Secondary | ICD-10-CM | POA: Diagnosis not present

## 2021-10-15 DIAGNOSIS — Z0000 Encounter for general adult medical examination without abnormal findings: Secondary | ICD-10-CM | POA: Diagnosis not present

## 2021-10-30 ENCOUNTER — Other Ambulatory Visit: Payer: Self-pay | Admitting: Orthopedic Surgery

## 2021-10-30 DIAGNOSIS — G8929 Other chronic pain: Secondary | ICD-10-CM

## 2021-11-13 ENCOUNTER — Ambulatory Visit
Admission: RE | Admit: 2021-11-13 | Discharge: 2021-11-13 | Disposition: A | Discharge status: Home / Self Care | Payer: BC Managed Care – PPO | Source: Ambulatory Visit | Attending: Orthopedic Surgery | Admitting: Orthopedic Surgery

## 2021-11-13 DIAGNOSIS — G8929 Other chronic pain: Secondary | ICD-10-CM | POA: Diagnosis present

## 2021-11-13 DIAGNOSIS — M25562 Pain in left knee: Secondary | ICD-10-CM | POA: Diagnosis present

## 2022-04-22 ENCOUNTER — Encounter (INDEPENDENT_AMBULATORY_CARE_PROVIDER_SITE_OTHER): Payer: Self-pay

## 2022-12-30 ENCOUNTER — Encounter: Payer: Self-pay | Admitting: Family Medicine

## 2022-12-30 ENCOUNTER — Ambulatory Visit: Payer: BC Managed Care – PPO | Admitting: Family Medicine

## 2022-12-30 DIAGNOSIS — Z113 Encounter for screening for infections with a predominantly sexual mode of transmission: Secondary | ICD-10-CM

## 2022-12-30 LAB — WET PREP FOR TRICH, YEAST, CLUE
Trichomonas Exam: NEGATIVE
Yeast Exam: NEGATIVE

## 2022-12-30 LAB — HM HIV SCREENING LAB: HM HIV Screening: NEGATIVE

## 2022-12-30 NOTE — Progress Notes (Signed)
  Pt is here for STD screening.  Wet mount results reviewed, no treatment required per standing order.  Condoms given.  

## 2022-12-30 NOTE — Progress Notes (Signed)
[facility] Department  STI clinic/screening visit [address] [place] [address] [phone]  Subjective:  Carrie Wise is a 42 y.o. female being seen today for an STI screening visit. The patient reports they do have symptoms.  Patient reports that they do not desire a pregnancy in the next year.   They reported they are not interested in discussing contraception today.    Patient's last menstrual period was 12/13/2022.  Patient has the following medical conditions:   Patient Active Problem List   Diagnosis Date Noted   Acute deep vein thrombosis (DVT) of left peroneal vein (HCC) 09/23/2019   Breast lump 06/20/2014   H/O nipple discharge 06/20/2014   Irregular menses 06/20/2014   Obesity 06/20/2014   Visit for routine gyn exam 06/20/2014   Vitamin D deficiency 06/20/2014    Chief Complaint  Patient presents with   SEXUALLY TRANSMITTED DISEASE    HPI  Patient reports to clinic for STI testing  Does the patient using douching products? No  Last HIV test per patient/review of record was No results found for: "HMHIVSCREEN" No results found for: "HIV" Patient reports last pap was No results found for: "DIAGPAP" No results found for: "SPECADGYN"  Screening for MPX risk: Does the patient have an unexplained rash? No Is the patient MSM? No Does the patient endorse multiple sex partners or anonymous sex partners? No Did the patient have close or sexual contact with a person diagnosed with MPX? No Has the patient traveled outside the [place] where MPX is endemic? No Is there a high clinical suspicion for MPX-- evidenced by one of the following No  -Unlikely to be chickenpox  -Lymphadenopathy  -Rash that present in same phase of evolution on any given body part See flowsheet for further details and programmatic requirements.   Immunization history:   There is no immunization history on file for this patient.   The following portions of the  patient's history were reviewed and updated as appropriate: allergies, current medications, past medical history, past social history, past surgical history and problem list.  Objective:  There were no vitals filed for this visit.  Physical Exam Vitals and nursing note reviewed.  Constitutional:      Appearance: Normal appearance.  HENT:     Head: Normocephalic and atraumatic.     Mouth/Throat:     Mouth: Mucous membranes are moist.     Pharynx: Oropharynx is clear. No oropharyngeal exudate or posterior oropharyngeal erythema.  Pulmonary:     Effort: Pulmonary effort is normal.  Abdominal:     General: Abdomen is flat.     Palpations: There is no mass.     Tenderness: There is no abdominal tenderness. There is no rebound.  Genitourinary:    General: Normal vulva.     Exam position: Lithotomy position.     Pubic Area: No rash or pubic lice.      Tanner stage (genital): 5.     Labia:        Right: No rash or lesion.        Left: No rash or lesion.      Vagina: Vaginal discharge present. No erythema, bleeding or lesions.     Cervix: Discharge and erythema present. No cervical motion tenderness, friability or lesion.     Uterus: Normal.      Adnexa: Left adnexa normal.       Right: Tenderness present.      Rectum: Normal.  Comments: pH = 6-7  Yellow- white discharge present Lymphadenopathy:     Head:     Right side of head: No preauricular or posterior auricular adenopathy.     Left side of head: No preauricular or posterior auricular adenopathy.     Cervical: No cervical adenopathy.     Upper Body:     Right upper body: No supraclavicular, axillary or epitrochlear adenopathy.     Left upper body: No supraclavicular, axillary or epitrochlear adenopathy.     Lower Body: No right inguinal adenopathy. No left inguinal adenopathy.  Skin:    General: Skin is warm and dry.     Findings: No rash.  Neurological:     Mental Status: She is alert and oriented to person, place,  and time.      Assessment and Plan:  Carrie Wise is a 42 y.o. female presenting to the [facility] Department for STI screening  1. Screening for venereal disease  - WET PREP FOR TRICH, YEAST, CLUE - Chlamydia/Gonorrhea [place] [facility] - HIV [place] [facility] - Syphilis Serology, [facility]   Patient accepted all screenings including  vaginal CT/GC and bloodwork for HIV/RPR, and wet prep. Patient meets criteria for HepB screening? No. Ordered? not applicable Patient meets criteria for HepC screening? No. Ordered? not applicable  Treat wet prep per standing order Discussed time line for [facility] results and that patient will be called with positive results and encouraged patient to call if she had not heard in 2 weeks.  Counseled to return or seek care for continued or worsening symptoms Recommended repeat testing in 3 months with positive results. Recommended condom use with all sex  Patient is currently using *Mirena to prevent pregnancy.    Return if symptoms worsen or fail to improve, for STI screening.  Future Appointments  Date Time Provider Department Center  12/30/2022  2:30 PM Lenice Llamas, FNP [facility] None  Total time spent 20 minutes  Lenice Llamas, [place]

## 2023-12-24 ENCOUNTER — Other Ambulatory Visit: Payer: Self-pay | Admitting: Internal Medicine

## 2023-12-24 DIAGNOSIS — M5416 Radiculopathy, lumbar region: Secondary | ICD-10-CM

## 2023-12-25 ENCOUNTER — Other Ambulatory Visit: Payer: Self-pay | Admitting: Internal Medicine

## 2023-12-25 DIAGNOSIS — M25552 Pain in left hip: Secondary | ICD-10-CM

## 2024-01-01 ENCOUNTER — Ambulatory Visit
Admission: RE | Admit: 2024-01-01 | Discharge: 2024-01-01 | Disposition: A | Discharge status: Home / Self Care | Source: Ambulatory Visit | Attending: Internal Medicine | Admitting: Internal Medicine

## 2024-01-01 DIAGNOSIS — M5416 Radiculopathy, lumbar region: Secondary | ICD-10-CM | POA: Diagnosis present

## 2024-01-01 DIAGNOSIS — M25552 Pain in left hip: Secondary | ICD-10-CM | POA: Insufficient documentation

## 2024-05-22 IMAGING — US US EXTREM LOW VENOUS*L*
1 series · 13 of 24 positions shown · non-contrast
Comparison: Left lower extremity venous Doppler
ultrasound-10/24/2020 (negative).

CLINICAL DATA: Left lower extremity pain. History of previous DVT
and smoking. Evaluate for acute or chronic DVT.



[Series 1: us venous img lower uni left (dvt) · portal-venous · 13 of 37 slices shown]
[im 1/37]
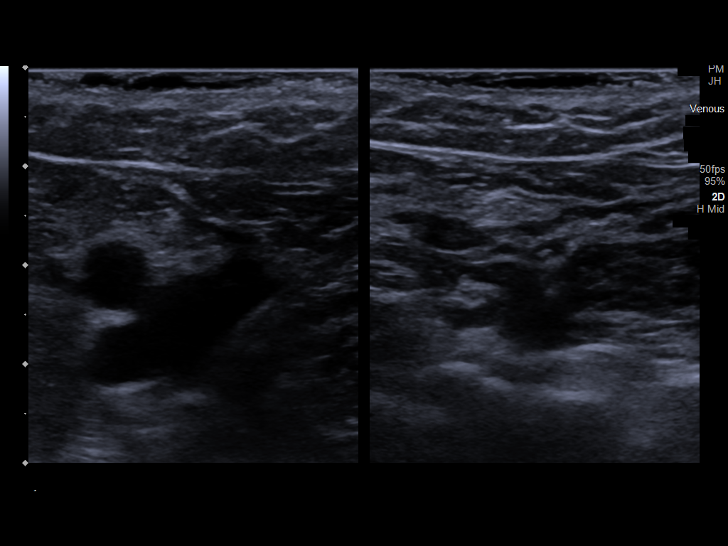
[im 4/37]
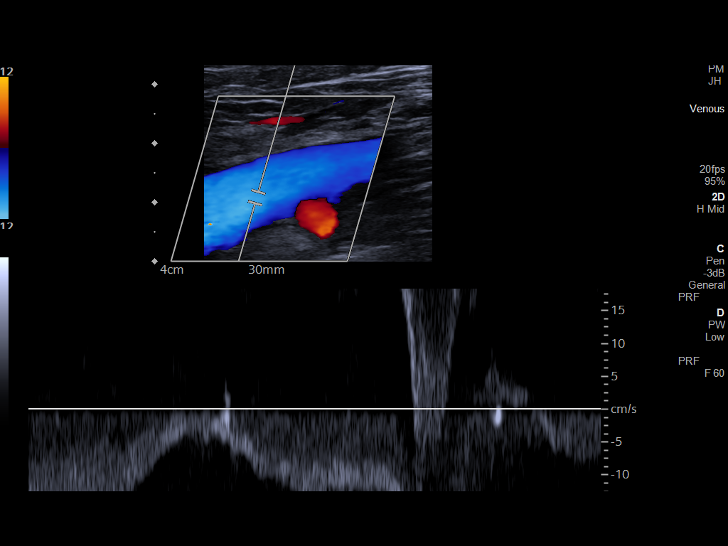
[im 7/37]
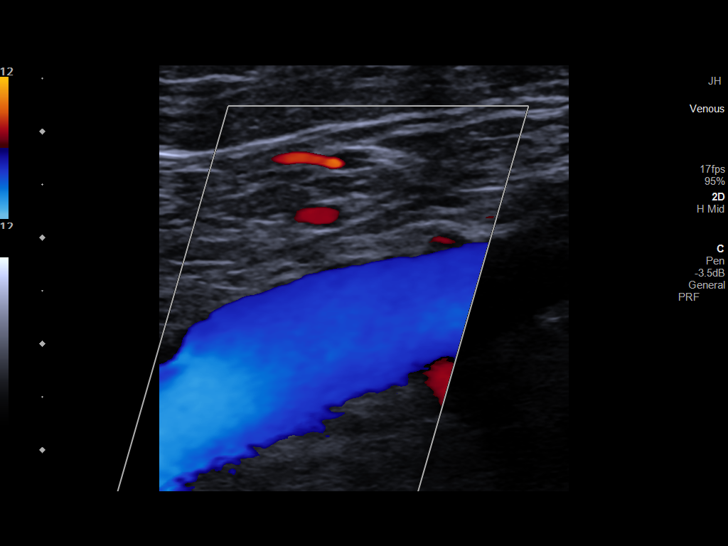
[im 10/37]
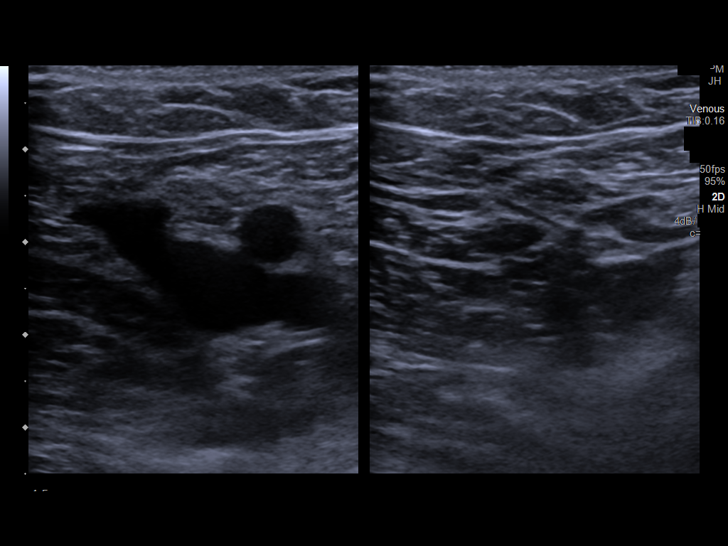
[im 13/37]
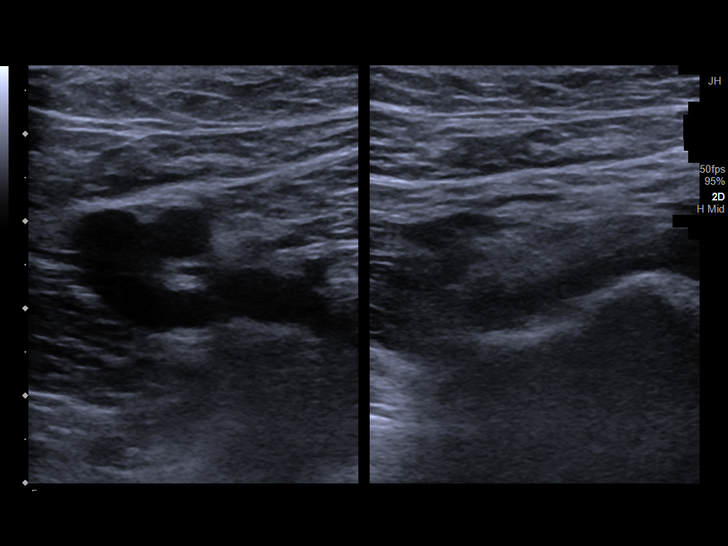
[im 16/37]
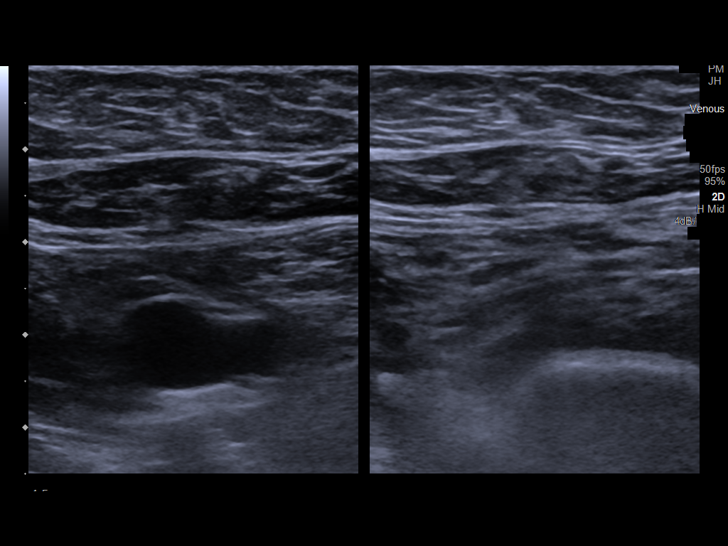
[im 19/37]
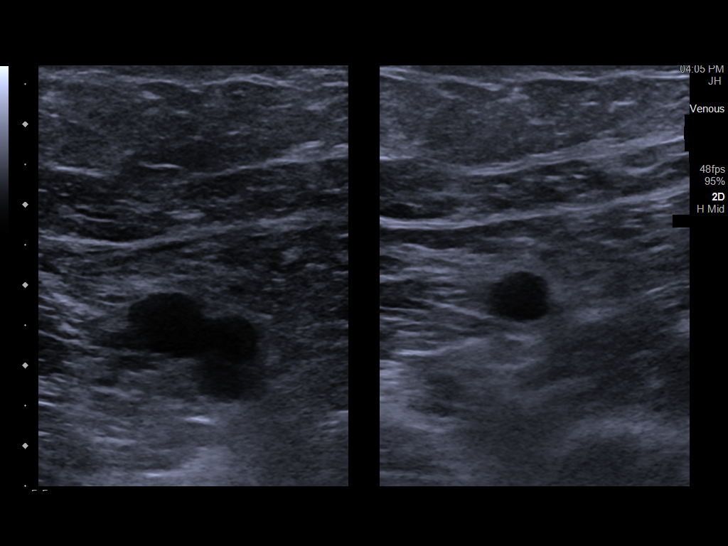
[im 21/37]
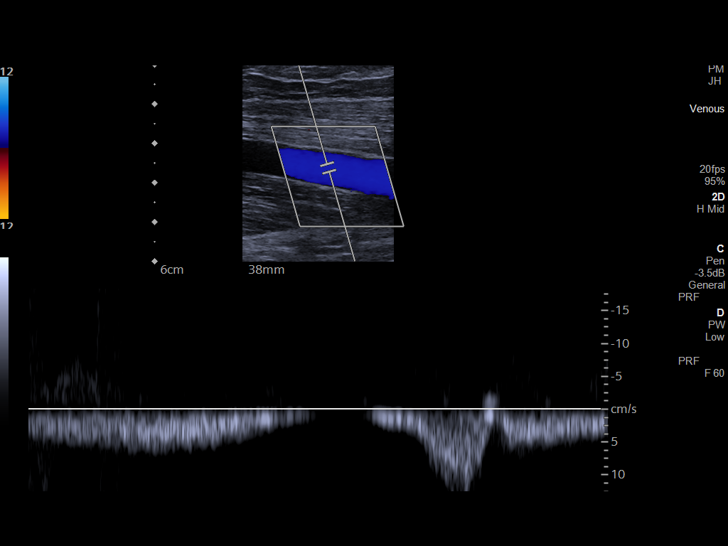
[im 24/37]
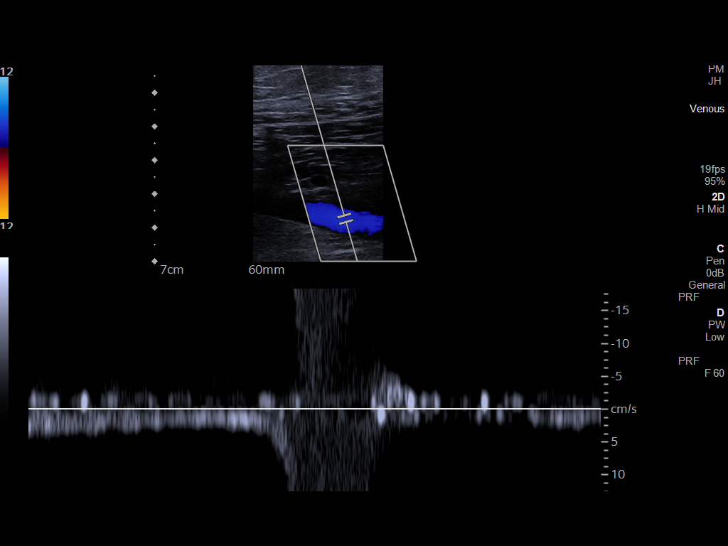
[im 27/37]
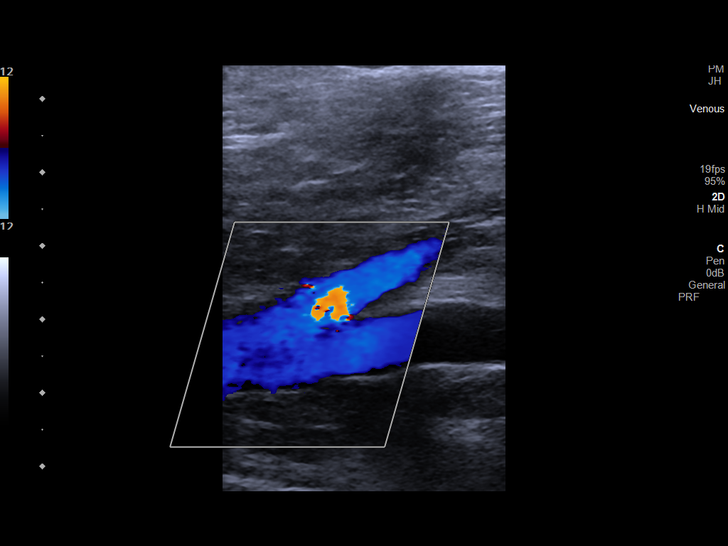
[im 30/37]
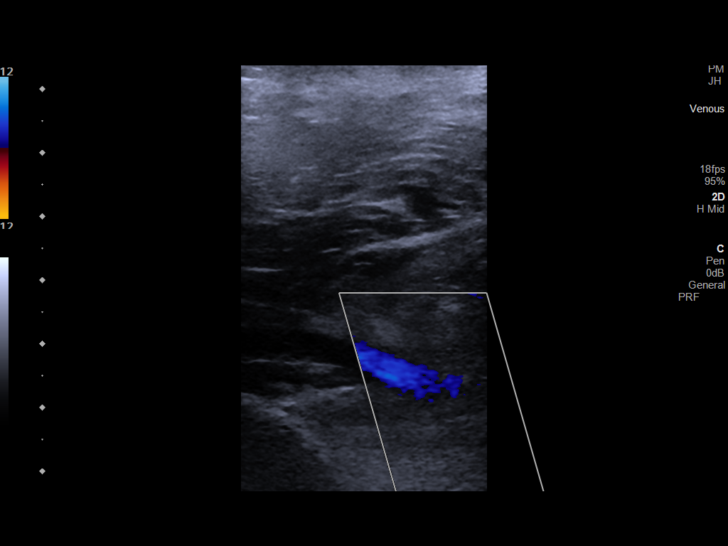
[im 33/37]
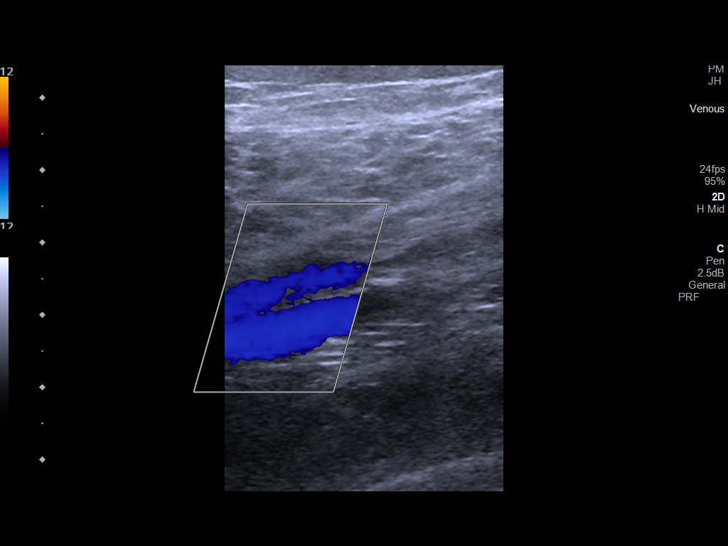
[im 37/37]
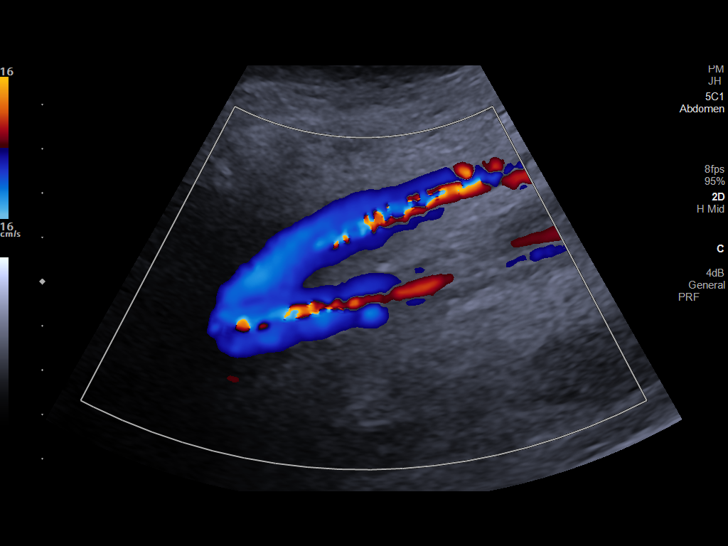

[13 of 24 positions shown; findings below may reference images not displayed]

FINDINGS: Contralateral Common Femoral Vein: Respiratory phasicity is normal
and symmetric with the symptomatic side. No evidence of thrombus.
Normal compressibility.

Common Femoral Vein: No evidence of thrombus. Normal
compressibility, respiratory phasicity and response to augmentation.

Saphenofemoral Junction: No evidence of thrombus. Normal
compressibility and flow on color Doppler imaging.

Profunda Femoral Vein: No evidence of thrombus. Normal
compressibility and flow on color Doppler imaging.

Femoral Vein: No evidence of thrombus. Normal compressibility,
respiratory phasicity and response to augmentation.

Popliteal Vein: No evidence of thrombus. Normal compressibility,
respiratory phasicity and response to augmentation.

Calf Veins: No evidence of thrombus. Normal compressibility and flow
on color Doppler imaging.

Superficial Great Saphenous Vein: No evidence of thrombus. Normal
compressibility.

Other Findings:  None.
IMPRESSION: No evidence of acute or chronic DVT within the left lower extremity.

## 2024-07-13 ENCOUNTER — Other Ambulatory Visit: Payer: Self-pay | Admitting: Internal Medicine

## 2024-07-13 DIAGNOSIS — I82552 Chronic embolism and thrombosis of left peroneal vein: Secondary | ICD-10-CM

## 2024-07-23 ENCOUNTER — Ambulatory Visit
Admission: RE | Admit: 2024-07-23 | Discharge: 2024-07-23 | Disposition: A | Source: Ambulatory Visit | Attending: Internal Medicine | Admitting: Internal Medicine

## 2024-07-23 DIAGNOSIS — I82552 Chronic embolism and thrombosis of left peroneal vein: Secondary | ICD-10-CM | POA: Insufficient documentation
